# Patient Record
Sex: Male | Born: 1945 | Race: White | Hispanic: No | Marital: Married | Smoking: Never smoker
Health system: Southern US, Academic
[De-identification: ages and names within clinical notes are randomized; demographics above are authoritative.]

---

## 2003-03-27 ENCOUNTER — Ambulatory Visit (INDEPENDENT_AMBULATORY_CARE_PROVIDER_SITE_OTHER): Payer: Self-pay | Admitting: Orthopaedics-Spine Service Non-Operative

## 2004-03-15 ENCOUNTER — Ambulatory Visit (INDEPENDENT_AMBULATORY_CARE_PROVIDER_SITE_OTHER): Payer: Self-pay | Admitting: Orthopaedics-Spine Service Non-Operative

## 2004-06-11 ENCOUNTER — Ambulatory Visit (INDEPENDENT_AMBULATORY_CARE_PROVIDER_SITE_OTHER): Payer: Self-pay | Admitting: Orthopaedics-Spine Service Non-Operative

## 2020-03-26 IMAGING — MR MRI CERVICAL SPINE WITHOUT CONTRAST
4 of 5 series · 23 of 48 positions shown · IV contrast (gadolinium)
Comparison: None available.

﻿EXAM:  MRI CERVICAL SPINE WITHOUT CONTRAST
INDICATION: Neck pain for several months.
TECHNIQUE: Multiplanar multisequential MRI of the cervical spine was performed without gadolinium contrast.

[Series 5: T2 · sagittal · 3.0mm · 0.75mm/px · 8 of 15 slices shown (1 of 2)]
[im 1/15]
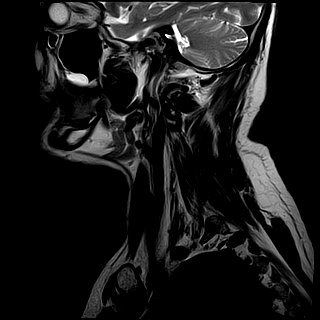
[im 3/15]
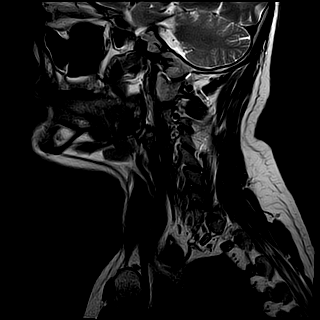
[im 5/15]
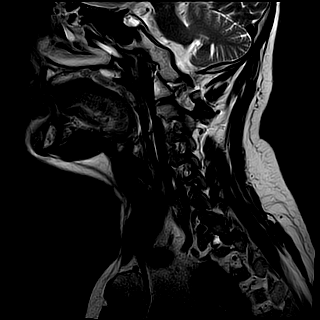
[im 7/15]
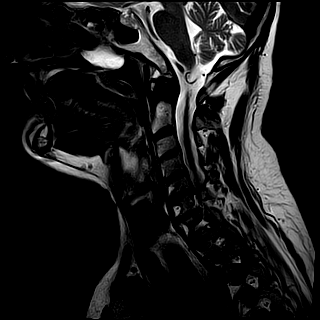
[im 9/15]
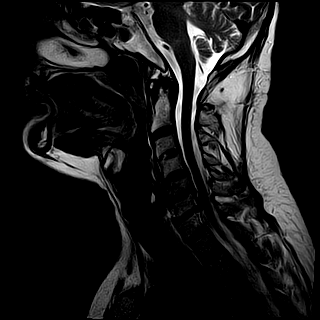
[im 11/15]
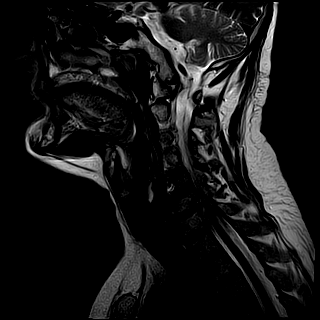
[im 13/15]
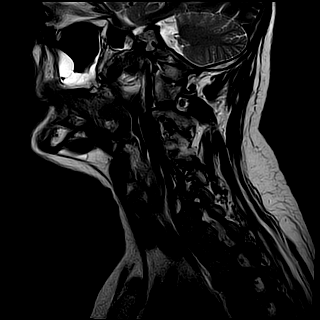
[im 15/15]
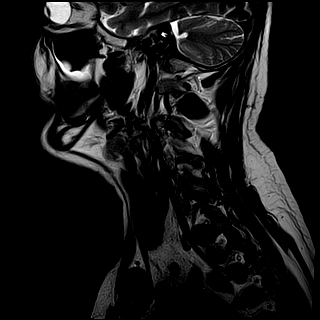

[Series 7: T1 · sagittal · 3.0mm · 0.47mm/px · 3 of 15 slices shown]
[im 2/15]
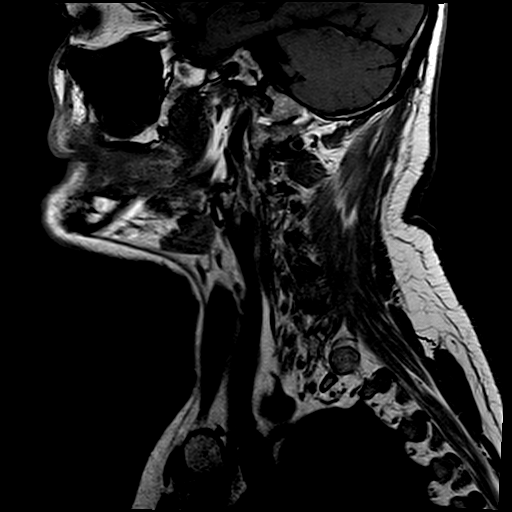
[im 8/15]
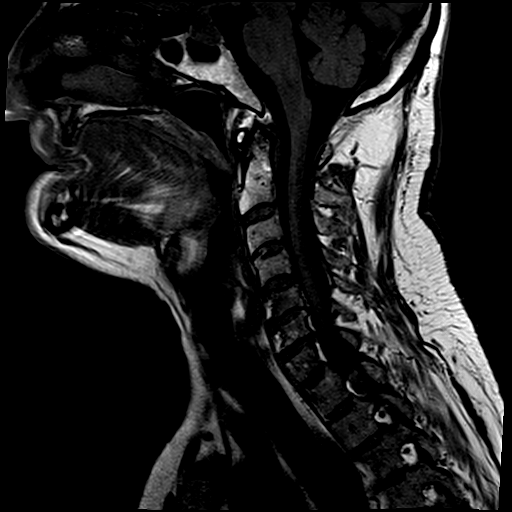
[im 13/15]
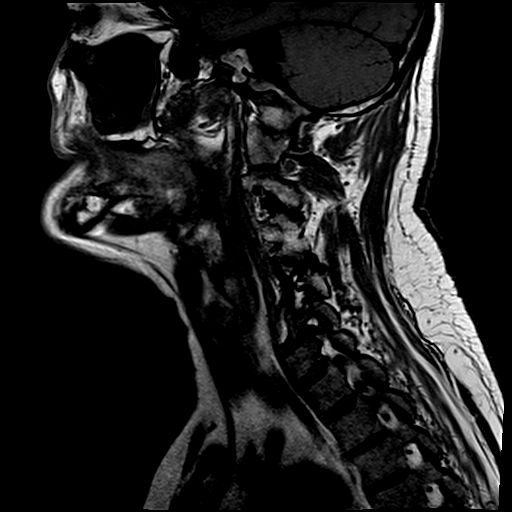

[Series 9: STIR · sagittal · 3.0mm · 0.47mm/px · 3 of 15 slices shown]
[im 2/15]
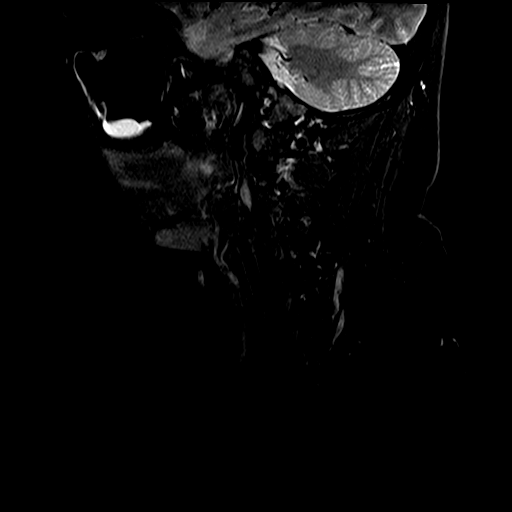
[im 8/15]
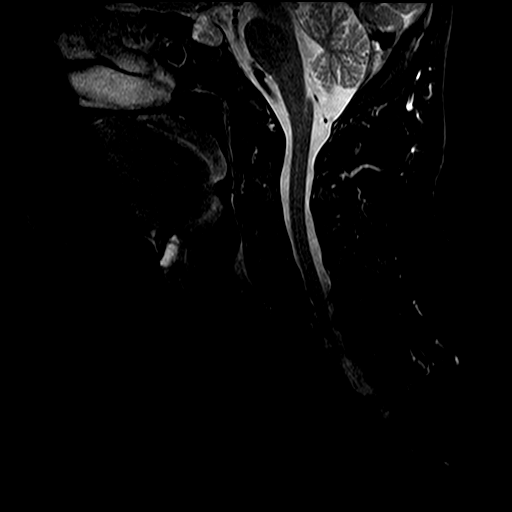
[im 13/15]
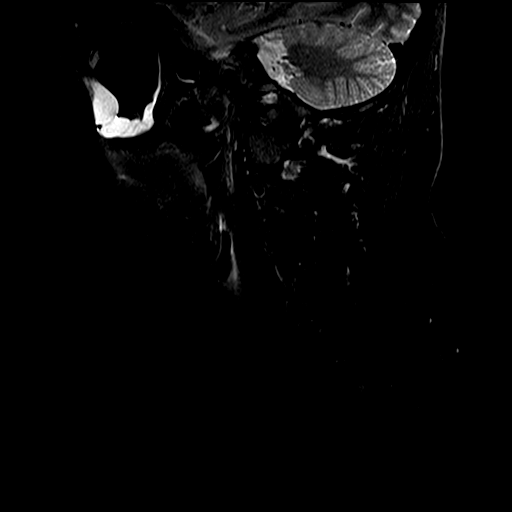

[Series 10: T2 · axial · 3.0mm · 0.39mm/px · z∈[-114,-7]mm · 9 of 18 slices shown (2 of 2)]
[im 1/18]
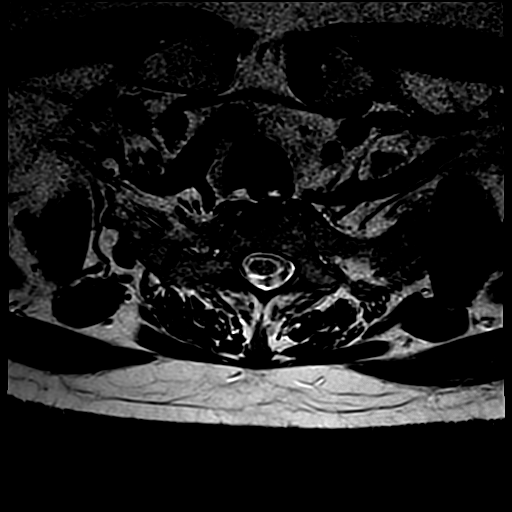
[im 2/18]
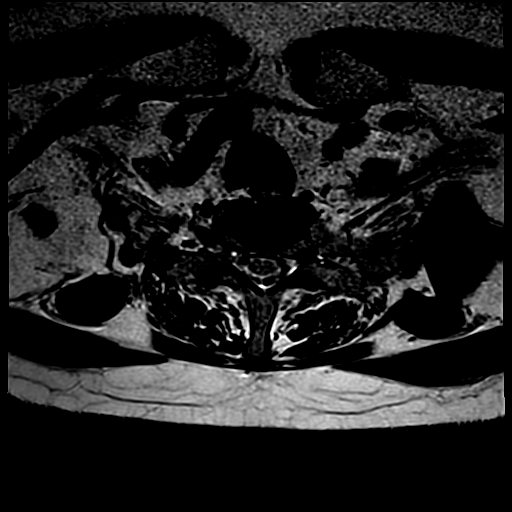
[im 4/18]
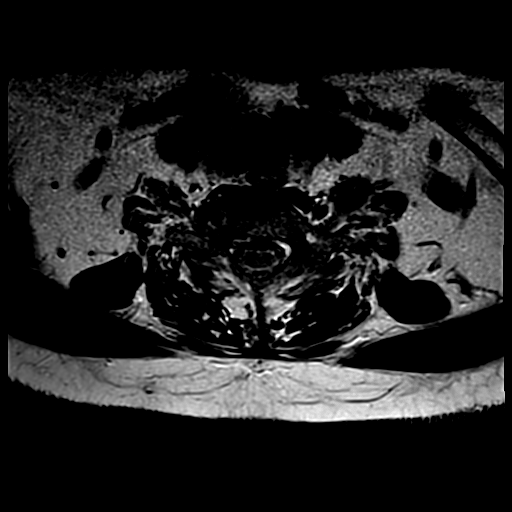
[im 6/18]
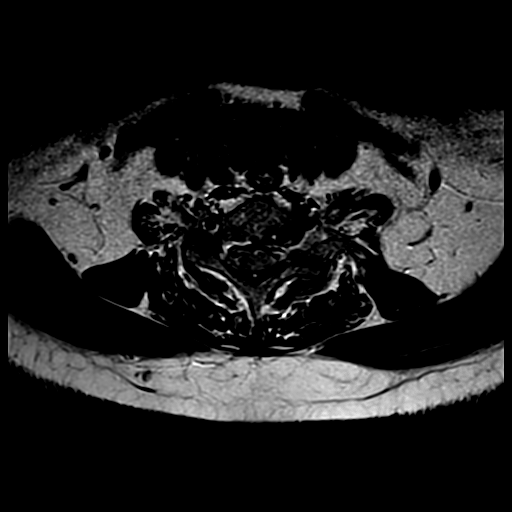
[im 7/18]
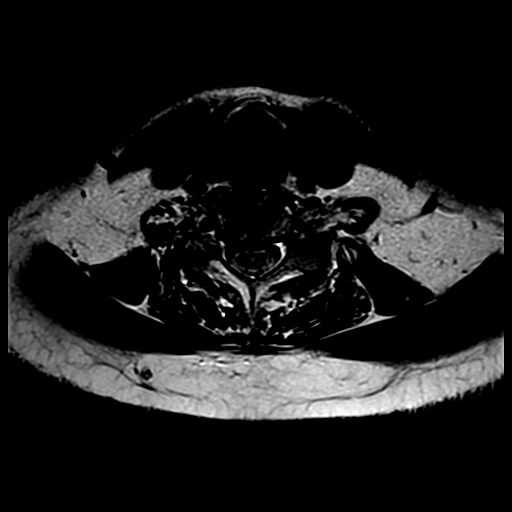
[im 9/18]
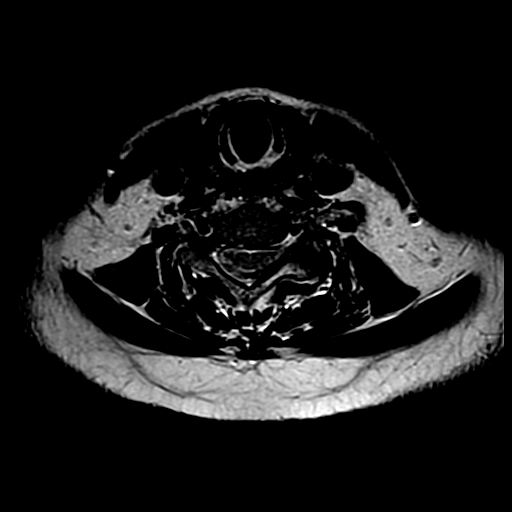
[im 11/18]
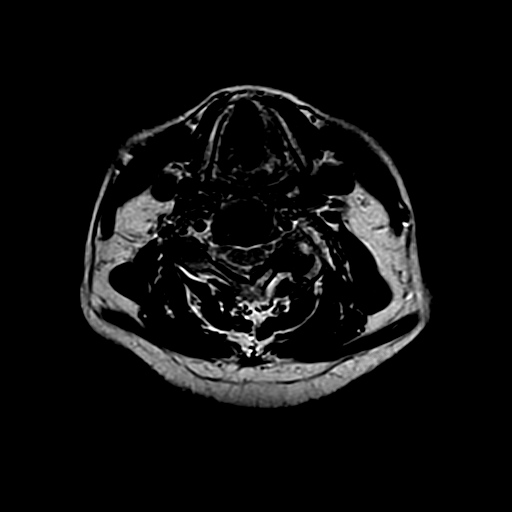
[im 12/18]
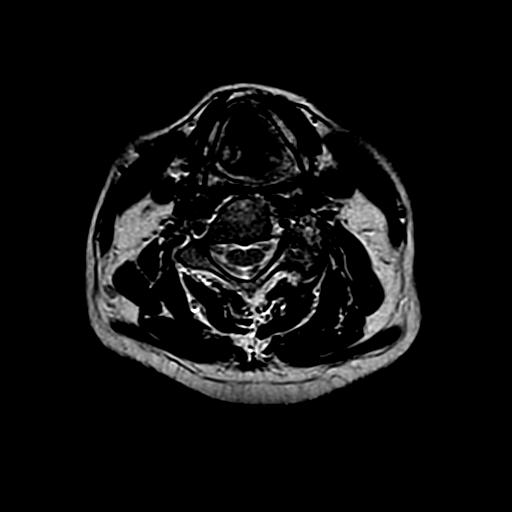
[im 16/18]
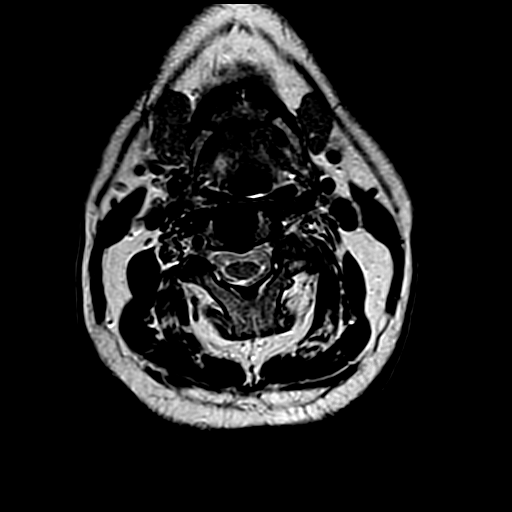

[23 of 48 positions shown; findings below may reference images not displayed]

FINDINGS: Vertebral bodies are normal in height, alignment and signal intensity. There is no acute fracture or subluxation. Visualized spinal cord is also normal in signal intensity without evidence of compression at any level.

At C2-3 level, there is moderate to severe left neural foraminal stenosis from facet arthropathy.

C3-4 level is unremarkable.

At C4-5 level, there is a minimal bulging annulus, minimally effacing the ventral CSF. There is moderate left neural foraminal stenosis from facet and uncovertebral joint hypertrophy.

At C5-6 level, there is a small broad-based central disc osteophyte complex resulting in near complete effacement of the ventral CSF. There is severe right neural foraminal stenosis from facet and uncovertebral joint hypertrophy.

At C6-7 level, there is a small broad-based central disc osteophyte complex with near complete effacement of the ventral CSF. There is severe bilateral neural foraminal stenosis from facet and uncovertebral joint hypertrophy.

At C7-T1 level, there is severe left neural foraminal stenosis from uncovertebral joint hypertrophy.

Paraspinal soft tissues are unremarkable.
IMPRESSION: 1. Near complete effacement of the ventral CSF at C5-6 and C6-7 levels from small central disc osteophyte complexes. 

2. Multilevel neural foraminal stenosis as detailed above.

## 2020-03-28 IMAGING — MR MRI BRAIN WITHOUT AND WITH CONTRAST
10 of 13 series · 35 of 48 positions shown · IV contrast (gadavist)
Comparison: CT head from outside facility dated 03/14/2020.

﻿EXAM:  MRI BRAIN WITHOUT AND WITH CONTRAST
INDICATION: Worsening headaches.
TECHNIQUE: Multiplanar multisequential MRI of the brain was performed without and with 5 mL of Gadavist.

[Series 5: DWI · axial · 5.0mm · 1.35mm/px · z∈[-35,+91]mm · 9 of 88 slices shown (1 of 3)]
[im 1/88]
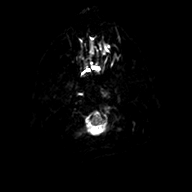
[im 16/88]
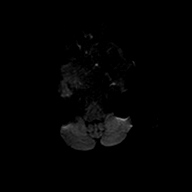
[im 24/88]
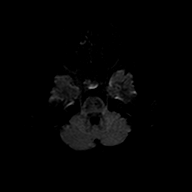
[im 40/88]
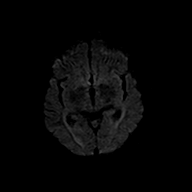
[im 48/88]
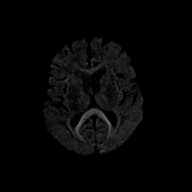
[im 64/88]
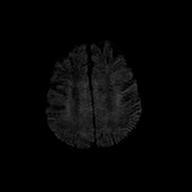
[im 72/88]
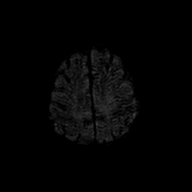
[im 80/88]
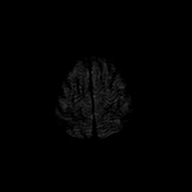
[im 88/88]
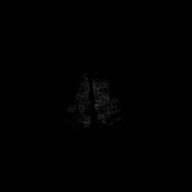

[Series 6: DWI · axial · 5.0mm · 1.35mm/px · z∈[-35,+91]mm · 3 of 22 slices shown (2 of 3)]
[im 1/22]
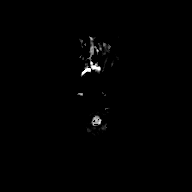
[im 11/22]
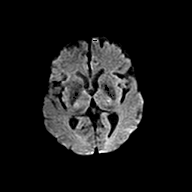
[im 22/22]
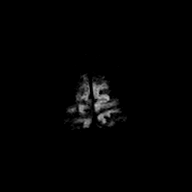

[Series 7: DWI · axial · 5.0mm · 1.35mm/px · z∈[-35,+91]mm · 3 of 22 slices shown (3 of 3)]
[im 1/22]
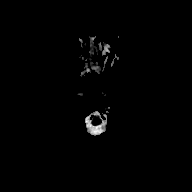
[im 11/22]
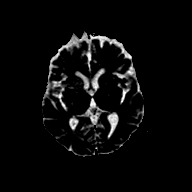
[im 22/22]
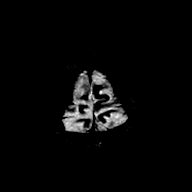

[Series 8: FLAIR · sagittal · 4.0mm · 0.75mm/px · 3 of 26 slices shown (1 of 2)]
[im 1/26]
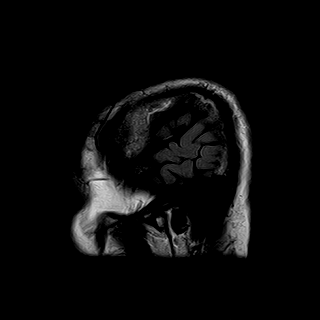
[im 13/26]
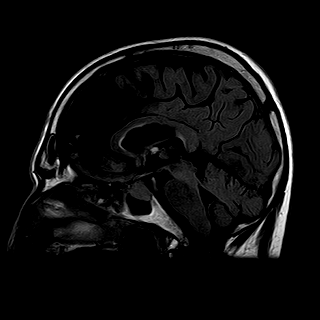
[im 26/26]
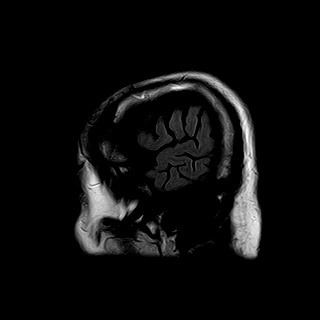

[Series 9: T2 · axial · 5.0mm · 0.43mm/px · z∈[-43,+113]mm · 3 of 27 slices shown (1 of 2)]
[im 1/27]
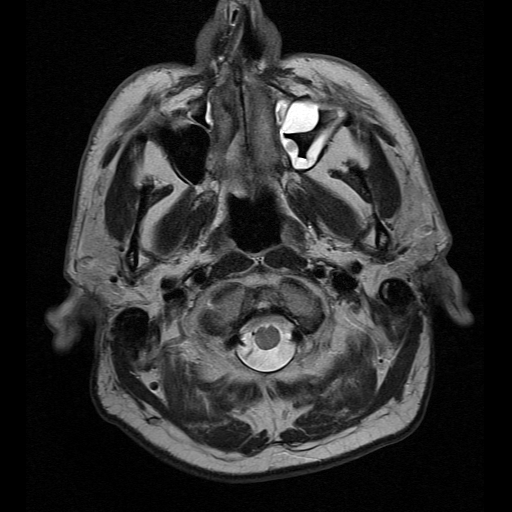
[im 14/27]
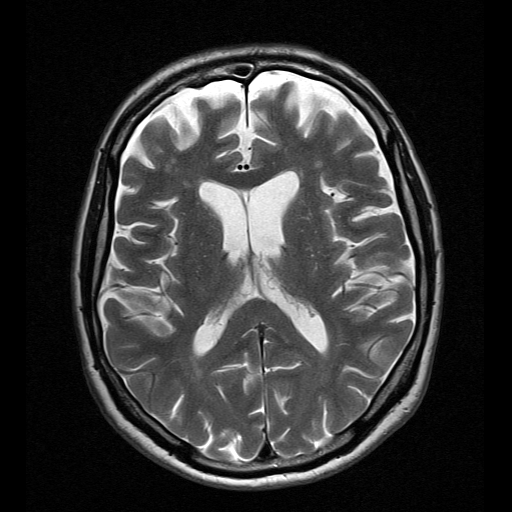
[im 27/27]
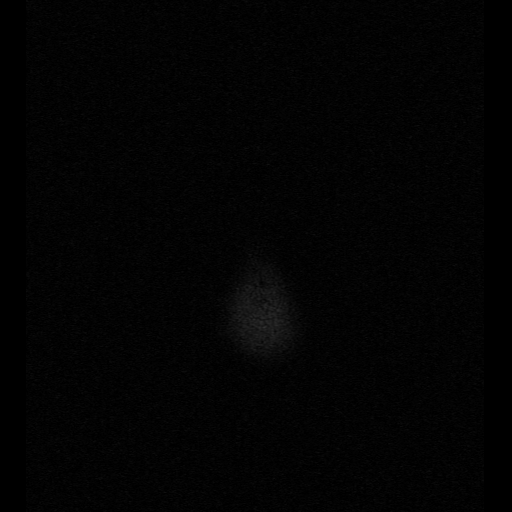

[Series 10: FLAIR · axial · 5.0mm · 0.43mm/px · z∈[-43,+113]mm · 3 of 27 slices shown (2 of 2)]
[im 1/27]
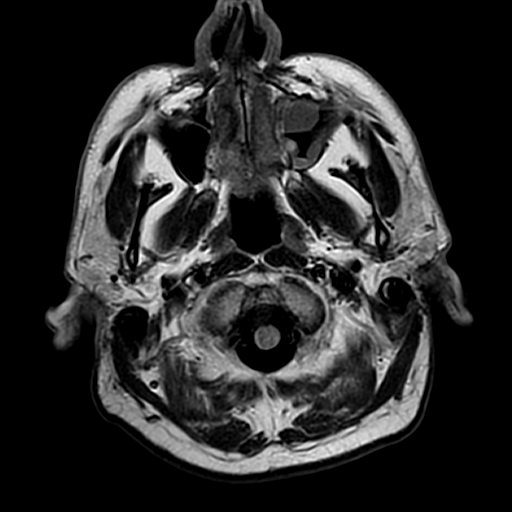
[im 14/27]
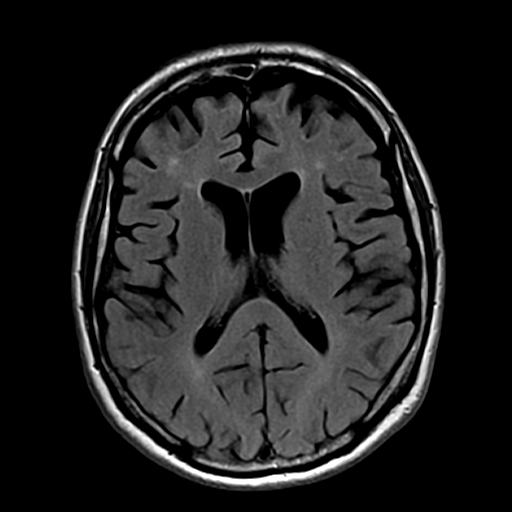
[im 27/27]
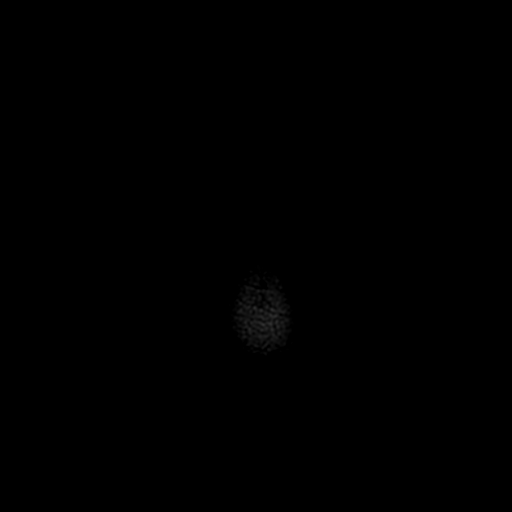

[Series 12: T1 · axial · 5.0mm · 0.43mm/px · z∈[-43,+113]mm · 3 of 27 slices shown]
[im 1/27]
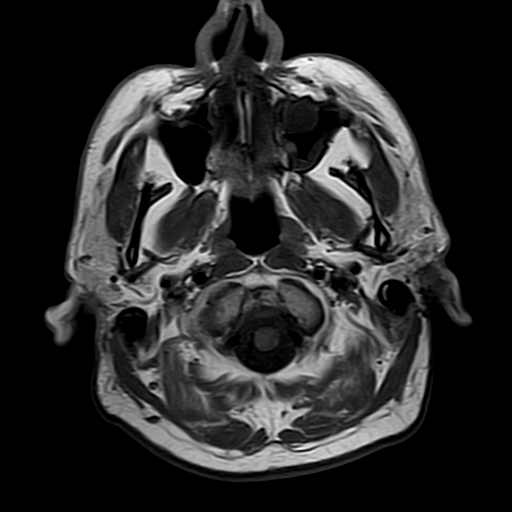
[im 14/27]
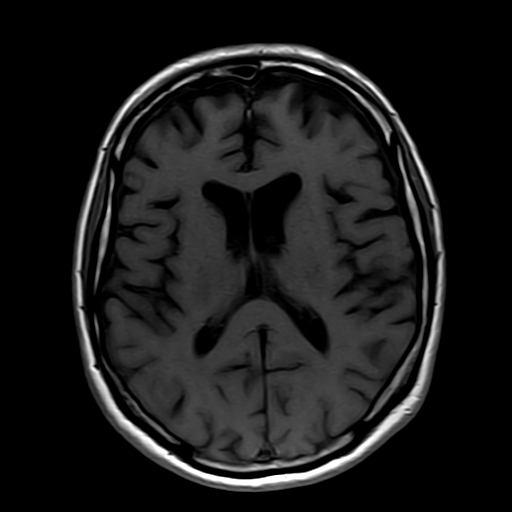
[im 27/27]
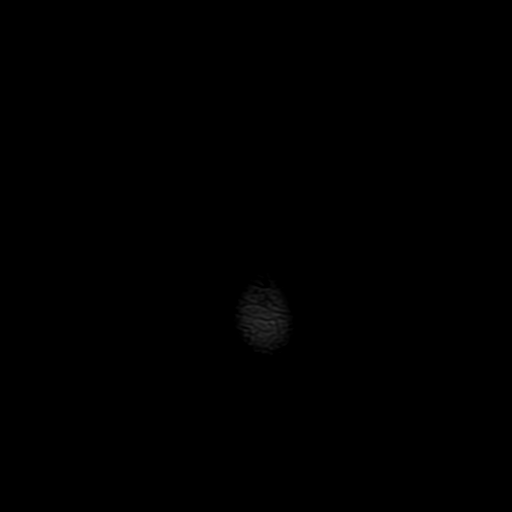

[Series 13: T2 · coronal · 6.0mm · 0.43mm/px · 3 of 24 slices shown (2 of 2)]
[im 1/24]
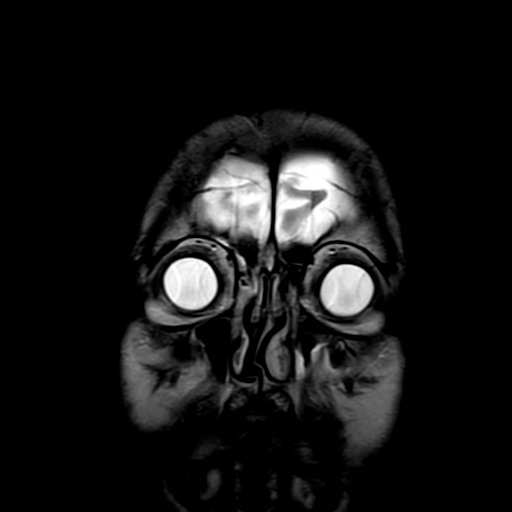
[im 12/24]
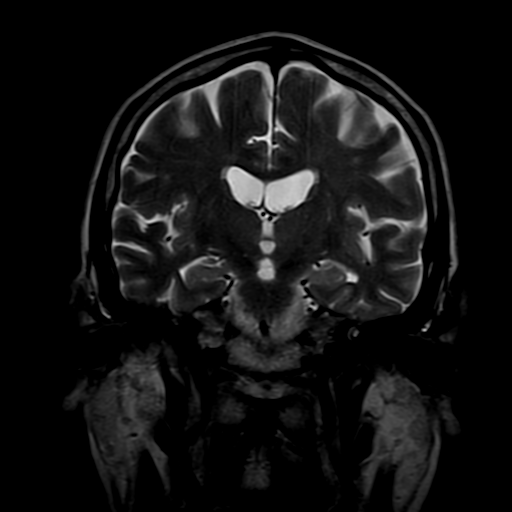
[im 24/24]
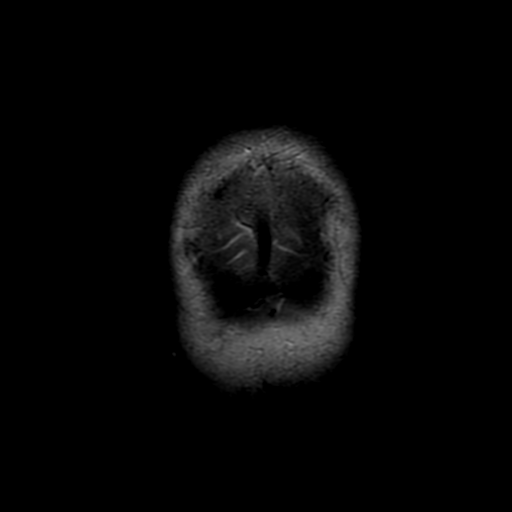

[Series 14: T1 fat-sat · coronal · 6.0mm · 0.57mm/px · 3 of 24 slices shown (1 of 2)]
[im 1/24]
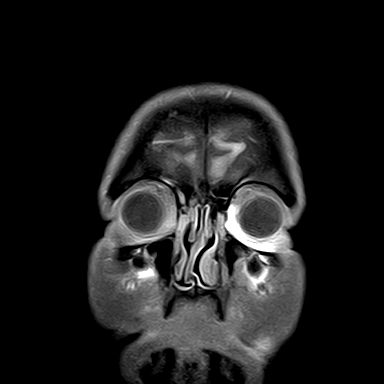
[im 12/24]
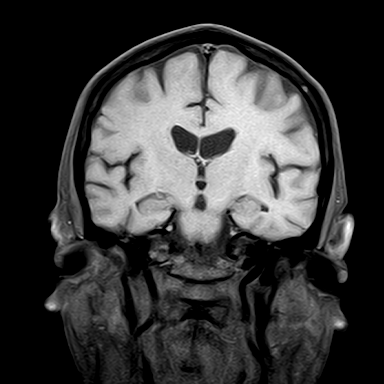
[im 24/24]
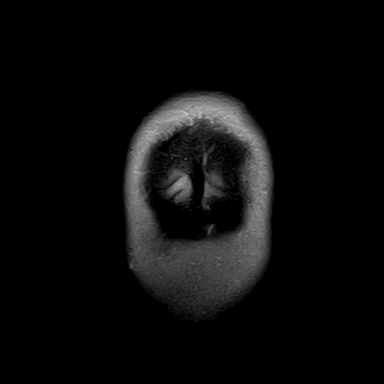

[Series 15: T1 fat-sat · axial · 5.0mm · 0.43mm/px · z∈[-43,+35]mm · 2 of 27 slices shown (2 of 2)]
[im 1/27]
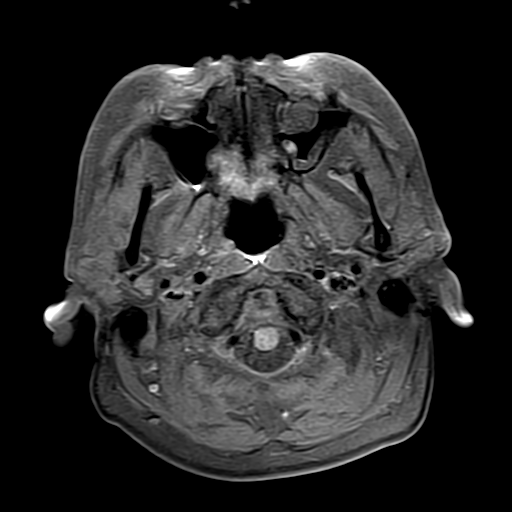
[im 14/27]
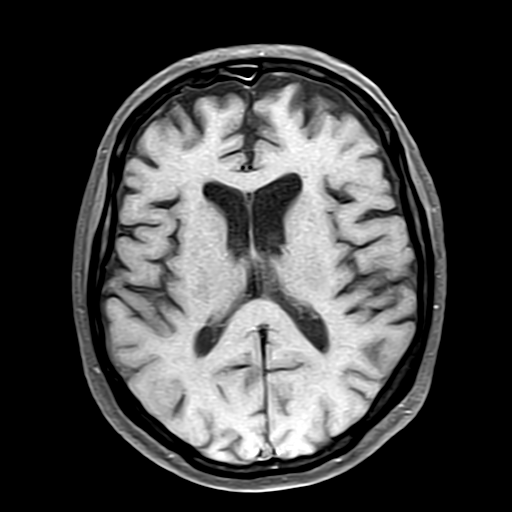

[35 of 48 positions shown; findings below may reference images not displayed]

FINDINGS: Ventricular and sulcal size is normal for the patient's age. There are mild chronic small vessel ischemic changes. There is no mass effect, midline shift or intracranial hemorrhage. There is no evidence of acute infarction or prior microhemorrhages. Skull base flow voids and basal cisterns are patent. There is a 2.1 x 1.4 cm enhancing sellar mass with suprasellar extension and mild mass effect on the optic chiasm. This mass also is in close proximity to the right internal carotid artery. There is no abnormal parenchymal or leptomeningeal enhancement. There are no extra-axial fluid collections. Visualized paranasal sinuses, mastoid air cells and orbital contents are unremarkable.
IMPRESSION: 1. Mild chronic small vessel ischemic changes, no acute intracranial abnormality. 

 2. Imaging findings most suggestive of a pituitary macroadenoma as detailed above. Continued follow-up is recommended.

## 2020-09-05 ENCOUNTER — Ambulatory Visit (HOSPITAL_COMMUNITY): Payer: Self-pay | Admitting: INTERNAL MEDICINE-ENDOCRINOLOGY-DIABETES AND METABOLISM

## 2021-02-06 IMAGING — MR MRI BRAIN WITHOUT AND WITH CONTRAST
11 of 13 series · 39 of 48 positions shown · IV contrast (gadavist)
Comparison: MRI dated 03/28/2020.

﻿EXAM:  MRI BRAIN WITHOUT AND WITH CONTRAST
INDICATION: Follow-up pituitary mass.
TECHNIQUE: Multiplanar multisequential MRI of the brain and pituitary gland was performed without and with 5 mL of Gadavist.

[Series 5: DWI · axial · 5.0mm · 1.35mm/px · z∈[-55,+71]mm · 11 of 88 slices shown (1 of 3)]
[im 1/88]
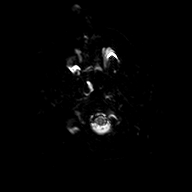
[im 9/88]
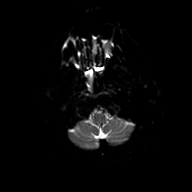
[im 18/88]
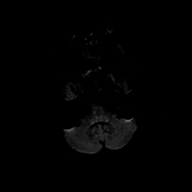
[im 27/88]
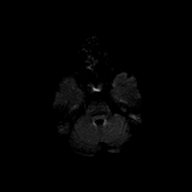
[im 35/88]
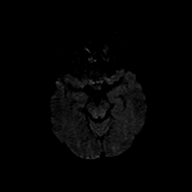
[im 44/88]
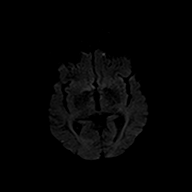
[im 53/88]
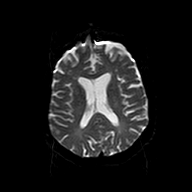
[im 61/88]
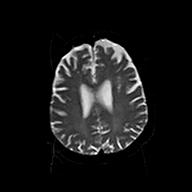
[im 70/88]
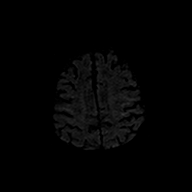
[im 79/88]
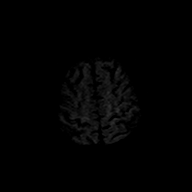
[im 88/88]
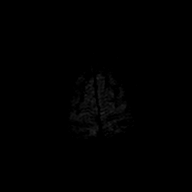

[Series 6: DWI · axial · 5.0mm · 1.35mm/px · z∈[-55,+71]mm · 2 of 22 slices shown (2 of 3)]
[im 1/22]
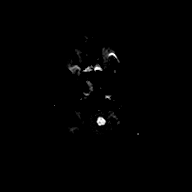
[im 22/22]
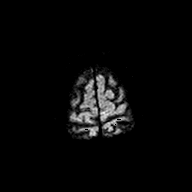

[Series 7: DWI · axial · 5.0mm · 1.35mm/px · z∈[-55,+71]mm · 3 of 22 slices shown (3 of 3)]
[im 1/22]
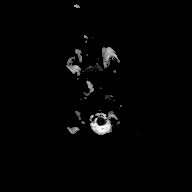
[im 11/22]
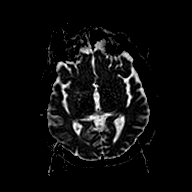
[im 22/22]
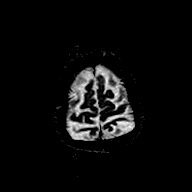

[Series 8: FLAIR · sagittal · 4.0mm · 0.75mm/px · 4 of 26 slices shown (1 of 2)]
[im 1/26]
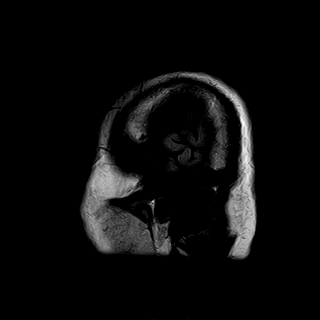
[im 9/26]
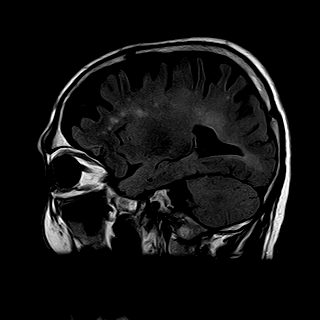
[im 17/26]
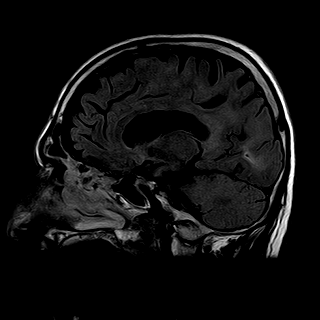
[im 26/26]
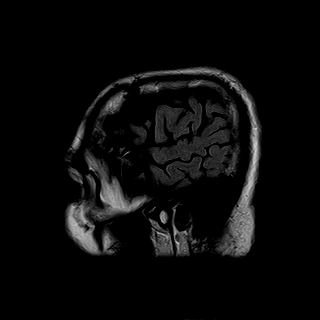

[Series 9: T2 · axial · 5.0mm · 0.43mm/px · z∈[-60,+96]mm · 4 of 27 slices shown]
[im 1/27]
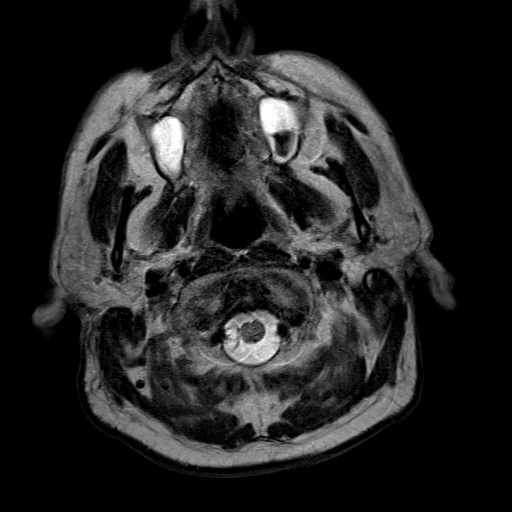
[im 9/27]
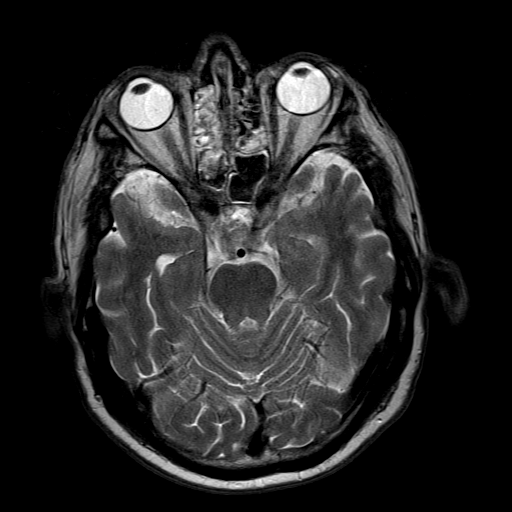
[im 18/27]
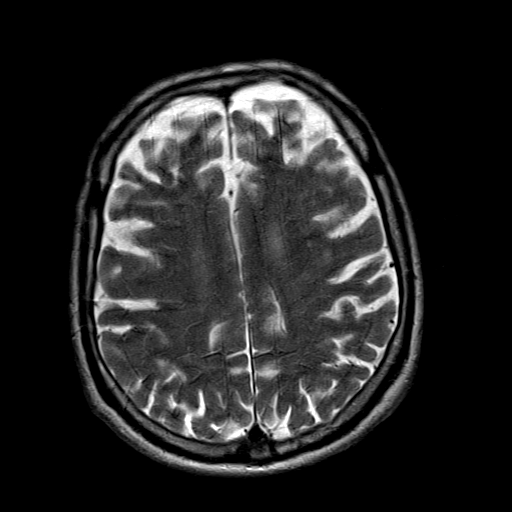
[im 27/27]
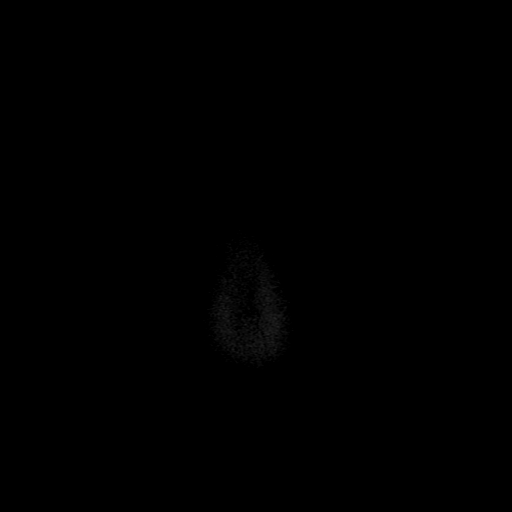

[Series 10: FLAIR · axial · 5.0mm · 0.43mm/px · z∈[-60,+96]mm · 4 of 27 slices shown (2 of 2)]
[im 1/27]
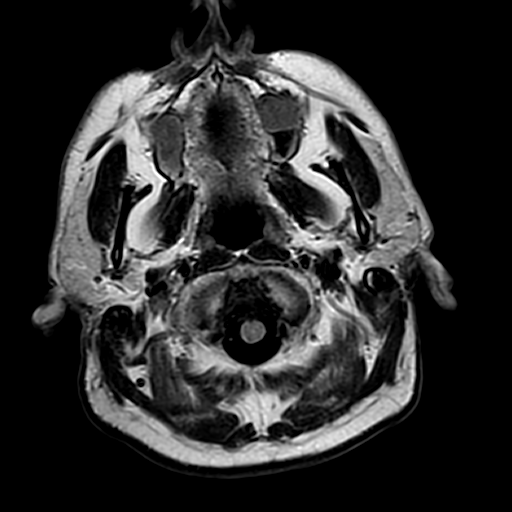
[im 9/27]
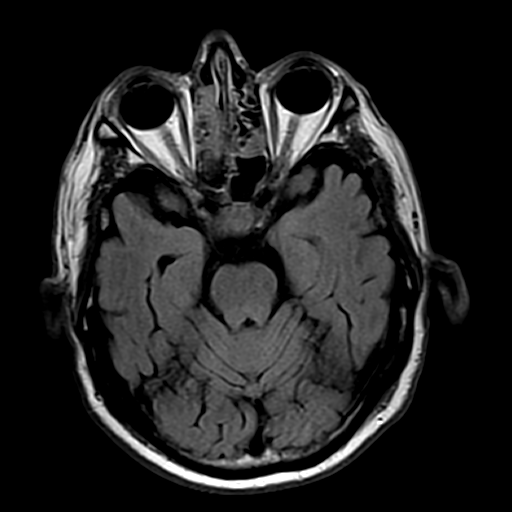
[im 18/27]
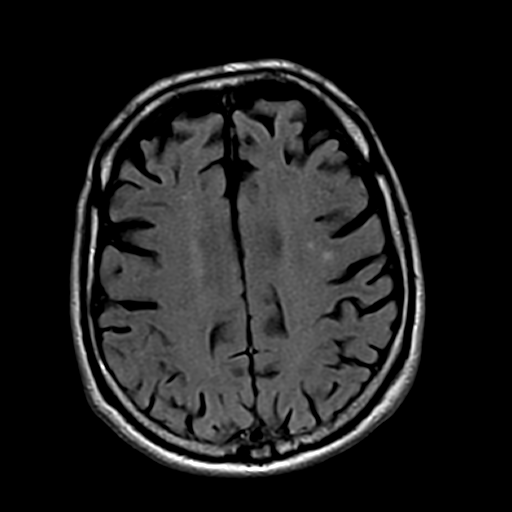
[im 27/27]
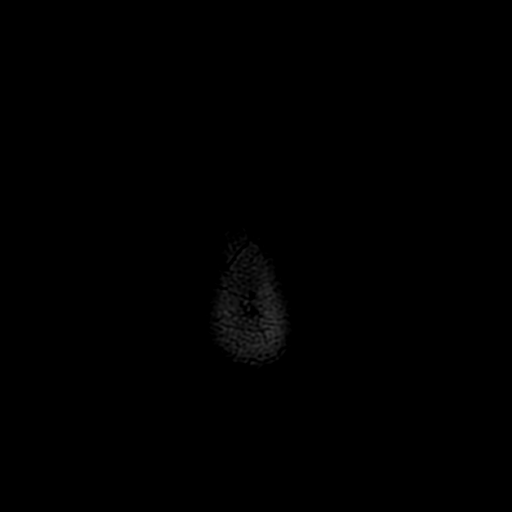

[Series 11: T1 · axial · 5.0mm · 0.43mm/px · z∈[-60,+96]mm · 4 of 27 slices shown (1 of 2)]
[im 1/27]
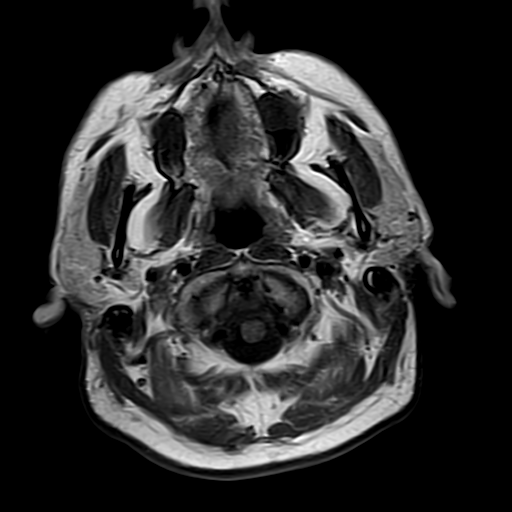
[im 9/27]
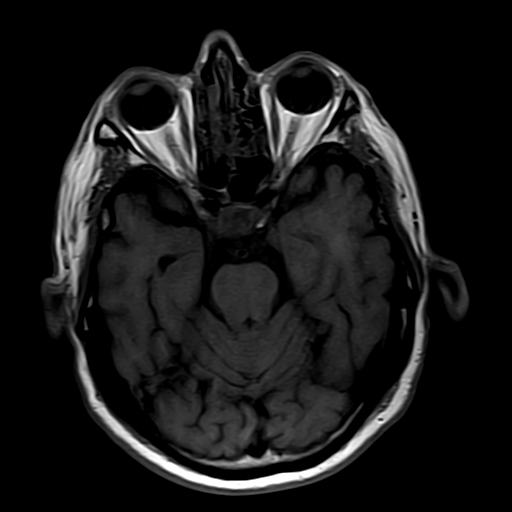
[im 18/27]
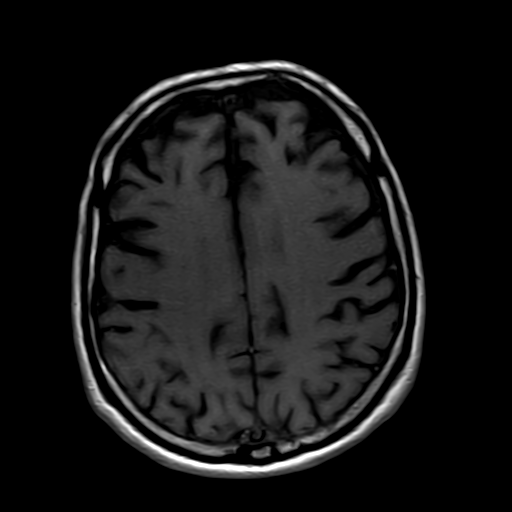
[im 27/27]
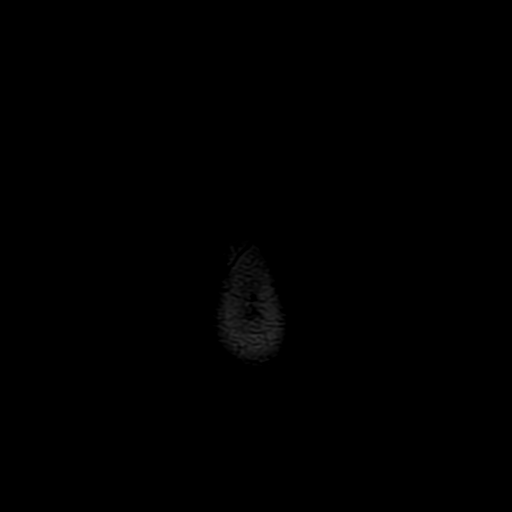

[Series 12: T1 · coronal · 3.0mm · 0.56mm/px · 2 of 13 slices shown (2 of 2)]
[im 1/13]
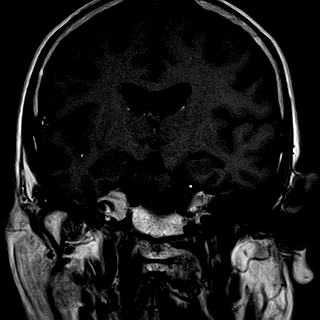
[im 13/13]
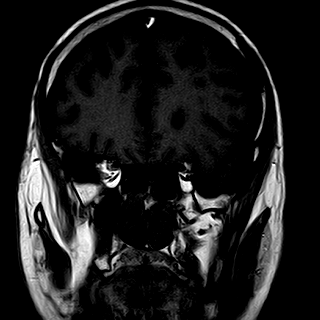

[Series 13: T1 fat-sat · axial · 5.0mm · 0.57mm/px · 1 of 27 slices shown]
[im 1/27]
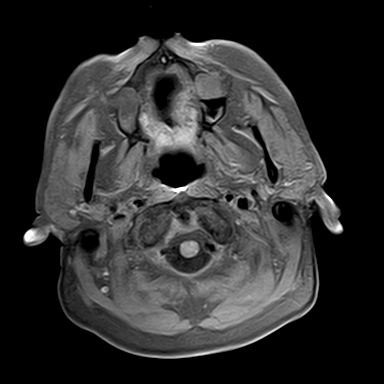

[Series 15: T1 post-contrast · coronal · 3.0mm · 0.56mm/px · 2 of 13 slices shown (1 of 2)]
[im 1/13]
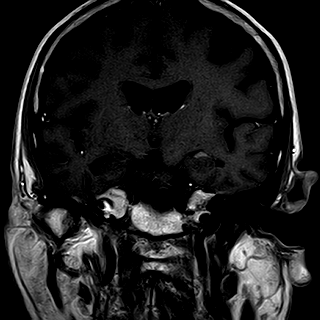
[im 13/13]
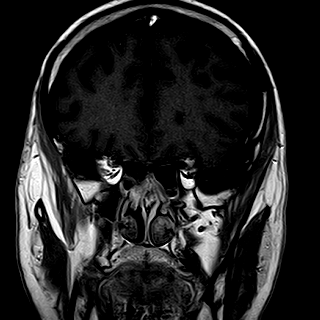

[Series 16: T1 post-contrast · sagittal · 3.0mm · 0.56mm/px · 2 of 13 slices shown (2 of 2)]
[im 1/13]
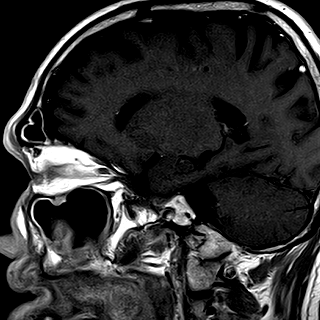
[im 13/13]
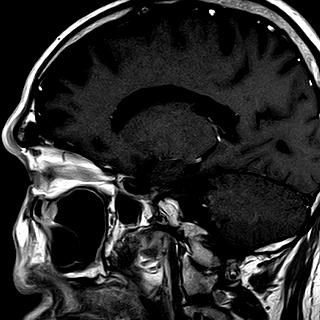

[39 of 48 positions shown; findings below may reference images not displayed]

FINDINGS: Ventricular and sulcal size is normal for the patient's age. There are mild chronic small vessel ischemic changes. There is no mass effect, midline shift or intracranial hemorrhage. There is no evidence of acute infarction or prior microhemorrhages. Skull base flow voids and basal cisterns are patent. A 2.1 x 1.4 cm pituitary macroadenoma is stable in size and redemonstrates mild mass effect on the optic chiasm. There is no abnormal parenchymal or leptomeningeal enhancement. There are no extra-axial fluid collections. There is partial opacification of all paranasal sinuses. Mastoid air cells and orbital contents are unremarkable.
IMPRESSION: 1. Stable pituitary macroadenoma. 

 2. Stable mild chronic small vessel ischemic changes, no acute intracranial abnormality.

## 2021-05-11 ENCOUNTER — Emergency Department (HOSPITAL_COMMUNITY): Payer: Self-pay | Admitting: FAMILY PRACTICE

## 2022-01-14 IMAGING — MR MRI BRAIN WITHOUT AND WITH CONTRAST
10 of 12 series · 35 of 48 positions shown · IV contrast (gadavist)
Comparison: Last MRI examination of brain and pituitary gland dated 02/06/2021.

﻿EXAM:  MRI BRAIN WITHOUT AND WITH CONTRAST INCLUDING PITUITARY GLAND:
INDICATION: 76-year-old with diagnosis of pituitary macroadenoma for follow-up.  No history of surgery of the pituitary fossa.  No mention of any medical treatment for pituitary macro adenoma.
TECHNIQUE: Multiplanar, multisequential MRI of the brain was performed without and with 10 mL of Gadavist.  Thin section images of pituitary gland and suprasellar region were performed pre and postcontrast.

[Series 5: DWI · axial · 5.0mm · 1.35mm/px · z∈[-50,+76]mm · 8 of 88 slices shown (1 of 3)]
[im 1/88]
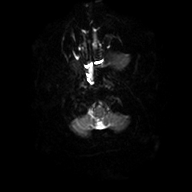
[im 14/88]
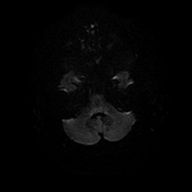
[im 27/88]
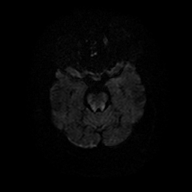
[im 41/88]
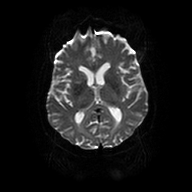
[im 47/88]
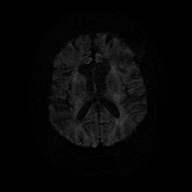
[im 61/88]
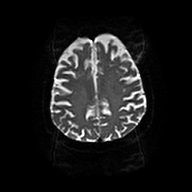
[im 74/88]
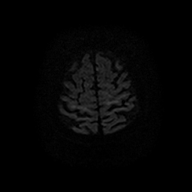
[im 88/88]
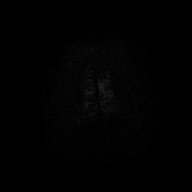

[Series 6: DWI · axial · 5.0mm · 1.35mm/px · z∈[-50,+76]mm · 3 of 22 slices shown (2 of 3)]
[im 1/22]
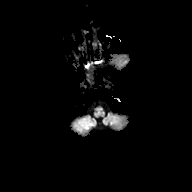
[im 11/22]
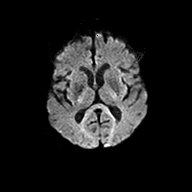
[im 22/22]
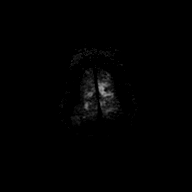

[Series 7: DWI · axial · 5.0mm · 1.35mm/px · z∈[-50,+76]mm · 3 of 22 slices shown (3 of 3)]
[im 1/22]
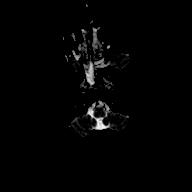
[im 11/22]
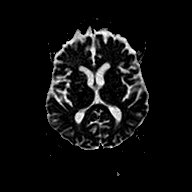
[im 22/22]
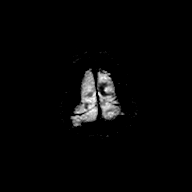

[Series 8: FLAIR · sagittal · 4.0mm · 0.75mm/px · 4 of 26 slices shown (1 of 2)]
[im 1/26]
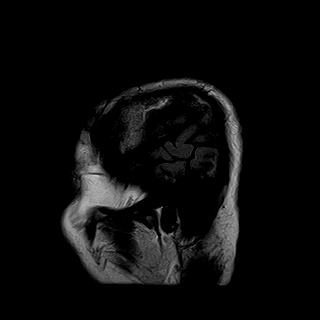
[im 9/26]
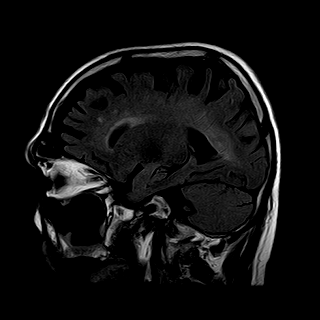
[im 17/26]
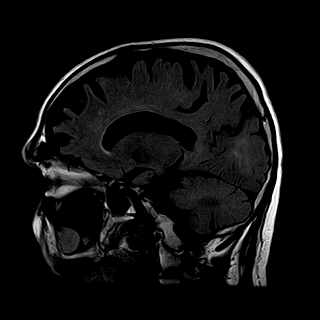
[im 26/26]
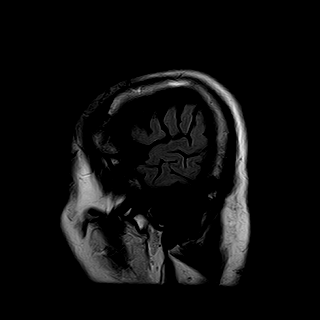

[Series 9: T2 · axial · 5.0mm · 0.43mm/px · z∈[-66,+78]mm · 4 of 25 slices shown]
[im 1/25]
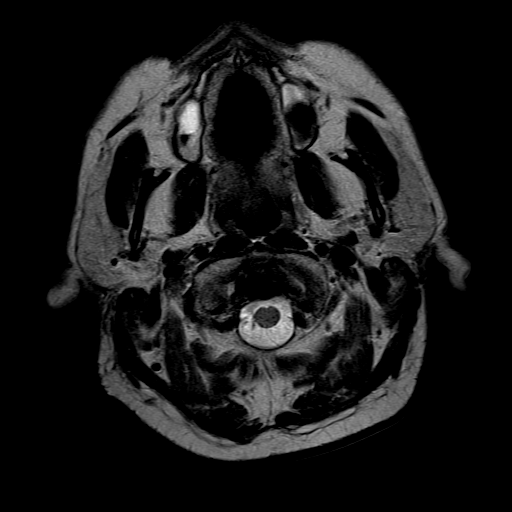
[im 9/25]
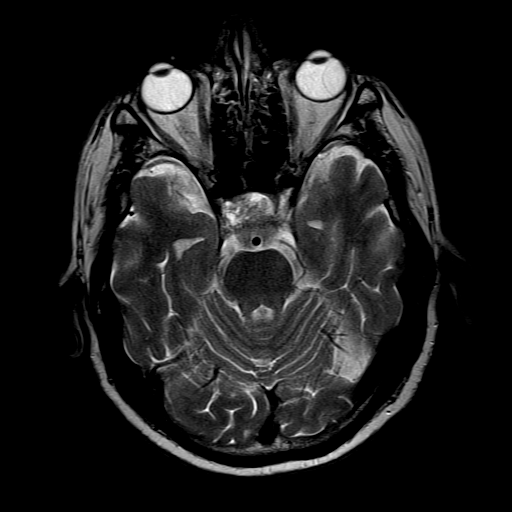
[im 17/25]
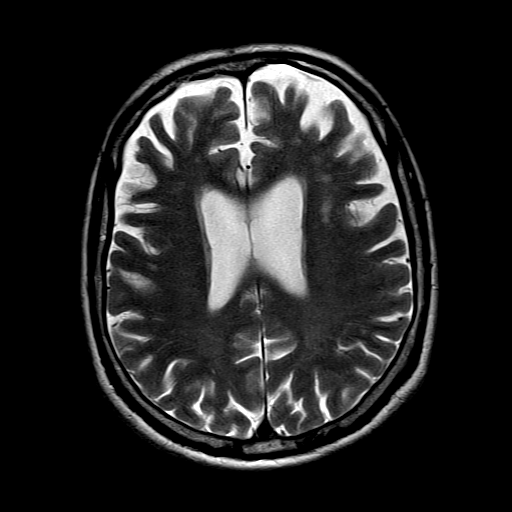
[im 25/25]
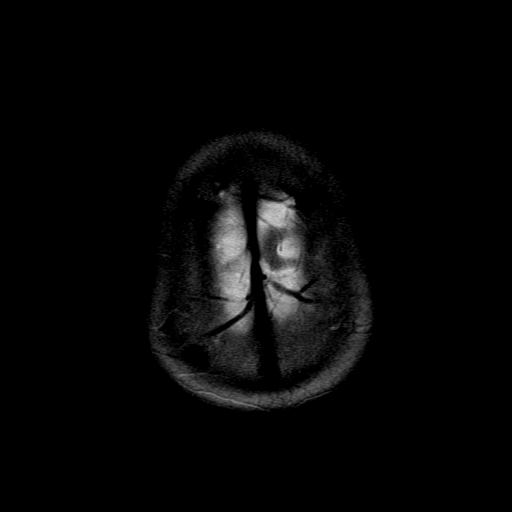

[Series 10: FLAIR · axial · 5.0mm · 0.43mm/px · z∈[-66,+78]mm · 4 of 25 slices shown (2 of 2)]
[im 1/25]
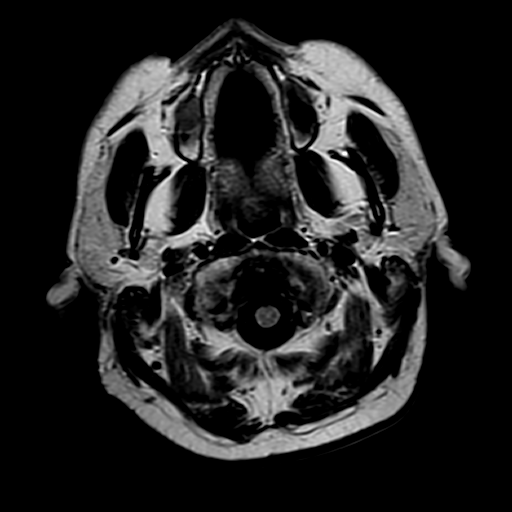
[im 9/25]
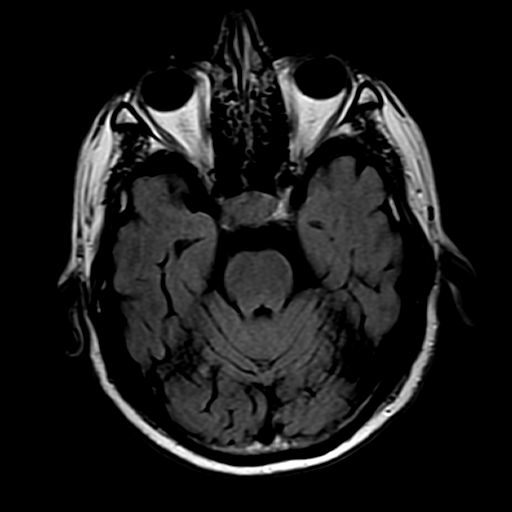
[im 17/25]
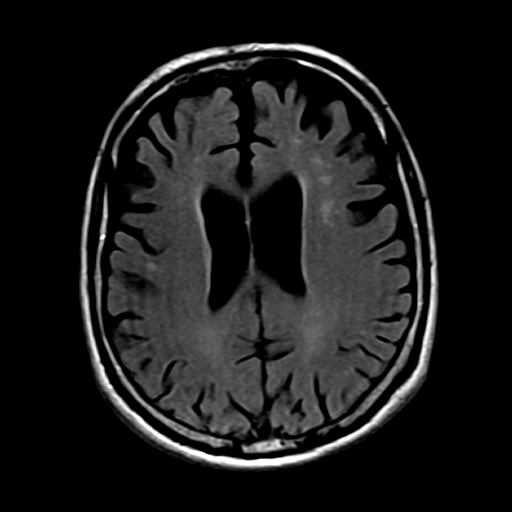
[im 25/25]
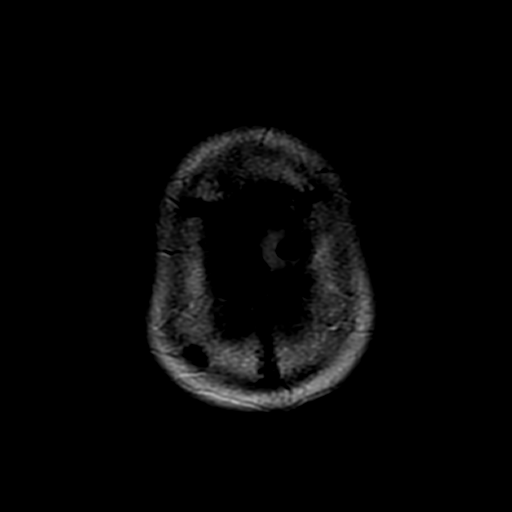

[Series 11: T1 · axial · 5.0mm · 0.43mm/px · 1 of 25 slices shown]
[im 1/25]
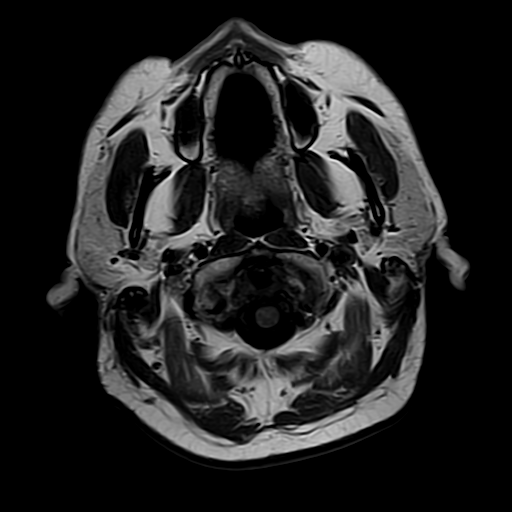

[Series 14: T1 post-contrast · coronal · 3.0mm · 0.56mm/px · 2 of 13 slices shown (1 of 3)]
[im 1/13]
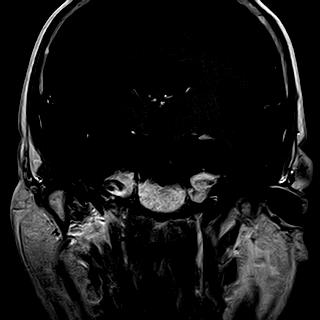
[im 13/13]
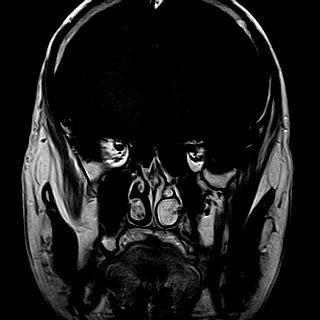

[Series 15: T1 post-contrast · sagittal · 3.0mm · 0.56mm/px · 2 of 13 slices shown (2 of 3)]
[im 1/13]
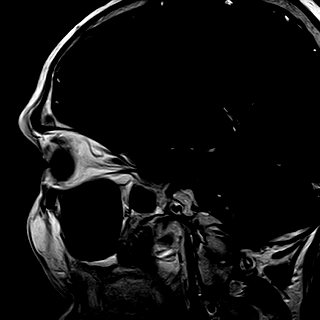
[im 13/13]
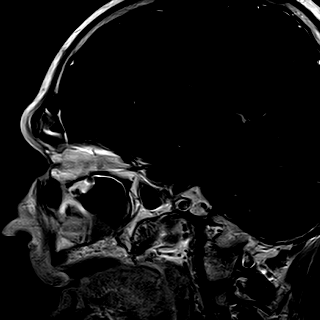

[Series 16: T1 post-contrast · axial · 5.0mm · 0.43mm/px · z∈[-66,+78]mm · 4 of 25 slices shown (3 of 3)]
[im 1/25]
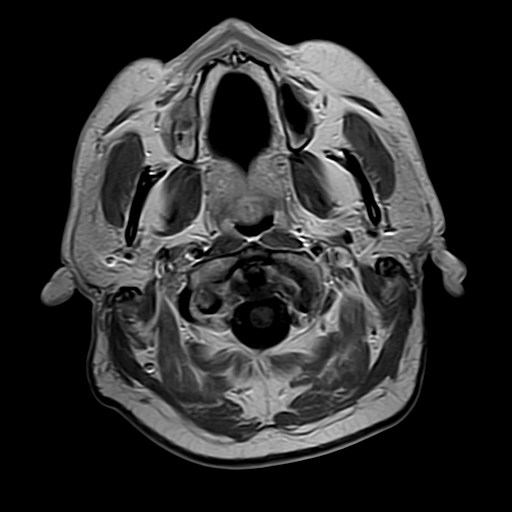
[im 9/25]
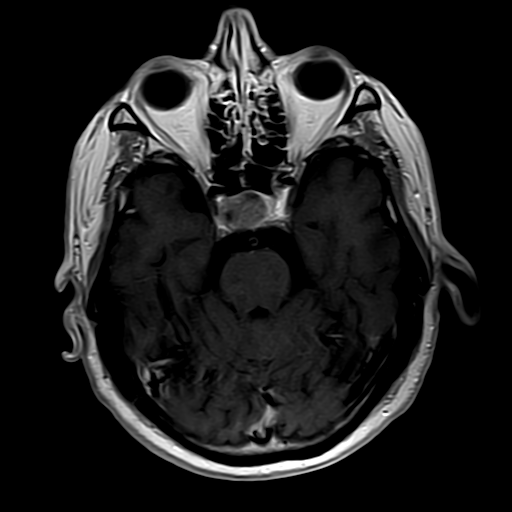
[im 17/25]
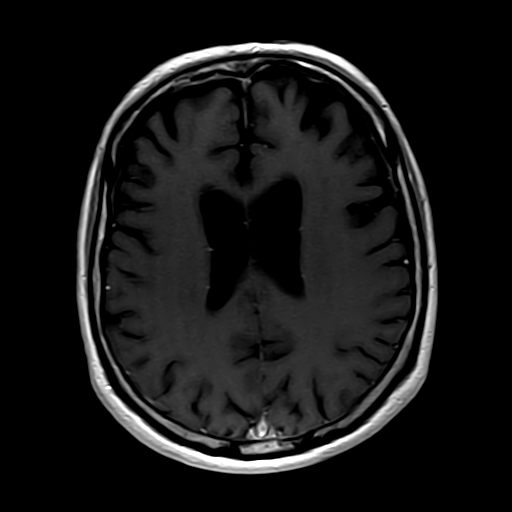
[im 25/25]
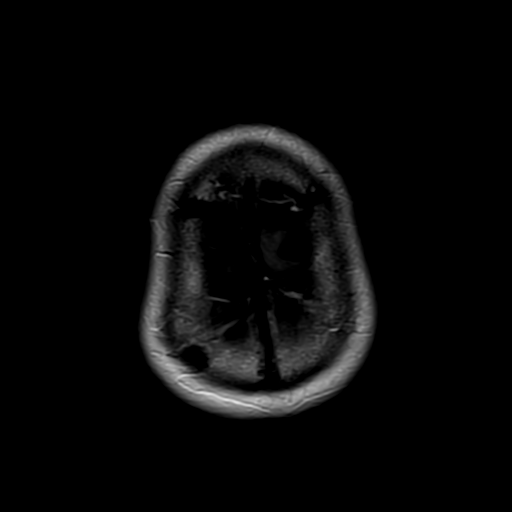

[35 of 48 positions shown; findings below may reference images not displayed]

FINDINGS: No focal areas of restricted diffusion.  No intracranial bleed or extra-axial collections.  No evidence of ventriculomegaly.

Pituitary macroadenoma is noted to the right of the midline extending to the suprasellar cistern.  The macroadenoma measures 16 x 15 x 16 mm, unchanged in size from prior studies.  Heterogeneous internal texture of the mass is noted.  Pituitary infundibulum is shifted to the left.

No other intracranial lesions or enhancement are noted.  Major arteries of circle of Willis and dural venous sinuses are patent.  Sinusitis involving maxillary and ethmoid sinuses are noted.
IMPRESSION: 1. Stable size of macroadenoma mass of the pituitary gland, to the right of the midline measuring 16 x 15 x 16 mm in size. Stable heterogeneous internal texture of the mass.  Extension of the pituitary mass to the suprasellar cistern is noted again similar to the prior studies with shift of the pituitary infundibulum to the left.  Mass is slightly impinging on optic chiasm.

2. No ventriculomegaly or midline shift.

3. Sinusitis.

## 2022-01-23 ENCOUNTER — Ambulatory Visit (INDEPENDENT_AMBULATORY_CARE_PROVIDER_SITE_OTHER): Payer: Self-pay | Admitting: Neurological Surgery

## 2022-01-23 NOTE — Telephone Encounter (Signed)
I left a voicemail to advised of appt, date, time, location, and details about appt available on MY CHART if applicable.  I provided call back number, option, extension, and hours. Advise patient to bring imaging disc and report to the appointment.     Shawnie Pons, Ambulatory Care Assistant

## 2022-04-16 ENCOUNTER — Ambulatory Visit (INDEPENDENT_AMBULATORY_CARE_PROVIDER_SITE_OTHER): Payer: 59 | Admitting: Neurological Surgery

## 2022-05-07 ENCOUNTER — Ambulatory Visit (INDEPENDENT_AMBULATORY_CARE_PROVIDER_SITE_OTHER): Payer: 59 | Admitting: Neurological Surgery

## 2022-06-25 ENCOUNTER — Ambulatory Visit (INDEPENDENT_AMBULATORY_CARE_PROVIDER_SITE_OTHER): Payer: 59 | Admitting: Neurological Surgery

## 2022-07-23 ENCOUNTER — Ambulatory Visit (INDEPENDENT_AMBULATORY_CARE_PROVIDER_SITE_OTHER): Payer: 59 | Admitting: Neurological Surgery

## 2022-08-20 ENCOUNTER — Ambulatory Visit (INDEPENDENT_AMBULATORY_CARE_PROVIDER_SITE_OTHER): Payer: 59 | Admitting: Neurological Surgery

## 2022-09-08 ENCOUNTER — Ambulatory Visit (INDEPENDENT_AMBULATORY_CARE_PROVIDER_SITE_OTHER): Payer: 59 | Admitting: Neurological Surgery

## 2022-10-06 ENCOUNTER — Ambulatory Visit (INDEPENDENT_AMBULATORY_CARE_PROVIDER_SITE_OTHER): Payer: Self-pay | Admitting: Neurological Surgery

## 2022-11-10 ENCOUNTER — Ambulatory Visit (INDEPENDENT_AMBULATORY_CARE_PROVIDER_SITE_OTHER): Payer: Self-pay | Admitting: Neurological Surgery

## 2022-12-15 ENCOUNTER — Ambulatory Visit (INDEPENDENT_AMBULATORY_CARE_PROVIDER_SITE_OTHER): Payer: Self-pay | Admitting: Neurological Surgery

## 2023-03-02 ENCOUNTER — Ambulatory Visit (INDEPENDENT_AMBULATORY_CARE_PROVIDER_SITE_OTHER): Payer: Medicare PPO | Admitting: Neurological Surgery

## 2023-04-16 ENCOUNTER — Emergency Department
Admission: EM | Admit: 2023-04-16 | Discharge: 2023-04-16 | Disposition: A | Discharge status: Home / Self Care | Payer: 59 | Attending: EMERGENCY MEDICINE | Admitting: EMERGENCY MEDICINE

## 2023-04-16 ENCOUNTER — Emergency Department (HOSPITAL_COMMUNITY): Payer: 59

## 2023-04-16 ENCOUNTER — Other Ambulatory Visit: Payer: Self-pay

## 2023-04-16 ENCOUNTER — Encounter (HOSPITAL_COMMUNITY): Payer: Self-pay

## 2023-04-16 DIAGNOSIS — K8020 Calculus of gallbladder without cholecystitis without obstruction: Secondary | ICD-10-CM | POA: Insufficient documentation

## 2023-04-16 DIAGNOSIS — R001 Bradycardia, unspecified: Secondary | ICD-10-CM | POA: Insufficient documentation

## 2023-04-16 DIAGNOSIS — I498 Other specified cardiac arrhythmias: Secondary | ICD-10-CM | POA: Insufficient documentation

## 2023-04-16 LAB — CBC WITH DIFF
BASOPHIL #: 0.14 10*3/uL (ref <=0.20)
BASOPHIL %: 2 %
EOSINOPHIL #: 0.18 10*3/uL (ref <=0.50)
EOSINOPHIL %: 2.6 %
HCT: 35.2 % — ABNORMAL LOW (ref 38.9–52.0)
HGB: 12.3 g/dL — ABNORMAL LOW (ref 13.4–17.5)
IMMATURE GRANULOCYTE #: 0.05 10*3/uL (ref <0.10)
IMMATURE GRANULOCYTE %: 0.7 % (ref 0.0–1.0)
LYMPHOCYTE #: 1.18 10*3/uL (ref 1.00–4.80)
LYMPHOCYTE %: 17.2 %
MCH: 40.3 pg — ABNORMAL HIGH (ref 26.0–32.0)
MCHC: 34.9 g/dL (ref 31.0–35.5)
MCV: 115.4 fL — ABNORMAL HIGH (ref 78.0–100.0)
MONOCYTE #: 1 10*3/uL (ref 0.20–1.10)
MONOCYTE %: 14.6 %
MPV: 10 fL (ref 8.7–12.5)
NEUTROPHIL #: 4.3 10*3/uL (ref 1.50–7.70)
NEUTROPHIL %: 62.9 %
PLATELETS: 308 10*3/uL (ref 150–400)
RBC: 3.05 10*6/uL — ABNORMAL LOW (ref 4.50–6.10)
RDW-CV: 15.2 % (ref 11.5–15.5)
WBC: 6.9 10*3/uL (ref 3.7–11.0)

## 2023-04-16 LAB — URINALYSIS, MACROSCOPIC
BILIRUBIN: NEGATIVE mg/dL
BLOOD: NEGATIVE mg/dL
GLUCOSE: NEGATIVE mg/dL
KETONES: NEGATIVE mg/dL
LEUKOCYTES: NEGATIVE WBCs/uL
NITRITE: NEGATIVE
PH: 5.5
PROTEIN: NEGATIVE mg/dL
SPECIFIC GRAVITY: >=1.03 (ref 1.005–1.030)

## 2023-04-16 LAB — URINALYSIS, MICROSCOPIC

## 2023-04-16 LAB — COMPREHENSIVE METABOLIC PANEL, NON-FASTING
ALBUMIN: 3.9 g/dL (ref 3.4–4.8)
ALKALINE PHOSPHATASE: 126 U/L — ABNORMAL HIGH (ref 45–115)
ALT (SGPT): 13 U/L (ref <43)
ANION GAP: 11 mmol/L (ref 4–13)
AST (SGOT): 25 U/L (ref 11–34)
BILIRUBIN TOTAL: 0.6 mg/dL (ref 0.3–1.3)
BUN/CREA RATIO: 18 (ref 6–22)
BUN: 22 mg/dL (ref 8–25)
CALCIUM: 9.6 mg/dL (ref 8.6–10.3)
CHLORIDE: 107 mmol/L (ref 96–111)
CO2 TOTAL: 22 mmol/L — ABNORMAL LOW (ref 23–31)
CREATININE: 1.2 mg/dL (ref 0.75–1.35)
ESTIMATED GFR - MALE: 62 mL/min/BSA (ref >=60)
GLUCOSE: 153 mg/dL — ABNORMAL HIGH (ref 65–125)
POTASSIUM: 3.4 mmol/L — ABNORMAL LOW (ref 3.5–5.1)
PROTEIN TOTAL: 7.6 g/dL (ref 6.0–8.0)
SODIUM: 140 mmol/L (ref 136–145)

## 2023-04-16 LAB — MORPHOLOGY

## 2023-04-16 LAB — ECG 12 LEAD
Atrial Rate: 57 {beats}/min
Calculated P Axis: 59 degrees
Calculated R Axis: -9 degrees
Calculated T Axis: 16 degrees
PR Interval: 160 ms
QRS Duration: 82 ms
QT Interval: 446 ms
QTC Calculation: 434 ms
Ventricular rate: 57 {beats}/min

## 2023-04-16 LAB — LAVENDER TOP TUBE

## 2023-04-16 LAB — GOLD TOP TUBE

## 2023-04-16 LAB — BLUE TOP TUBE

## 2023-04-16 LAB — TROPONIN-I: TROPONIN-I HS: 7.2 ng/L (ref <19.8)

## 2023-04-16 LAB — LIPASE: LIPASE: 41 U/L (ref 10–60)

## 2023-04-16 MED ORDER — ONDANSETRON HCL (PF) 4 MG/2 ML INJECTION SOLUTION
4.0000 mg | INTRAMUSCULAR | Status: AC
Start: 2023-04-16 — End: 2023-04-16
  Administered 2023-04-16: 4 mg via INTRAVENOUS
  Filled 2023-04-16: qty 2

## 2023-04-16 MED ORDER — IOPAMIDOL 370 MG IODINE/ML (76 %) INTRAVENOUS SOLUTION
50.0000 mL | INTRAVENOUS | Status: AC
Start: 2023-04-16 — End: 2023-04-16
  Administered 2023-04-16: 50 mL via INTRAVENOUS

## 2023-04-16 MED ORDER — KETOROLAC 30 MG/ML (1 ML) INJECTION SOLUTION
15.0000 mg | INTRAMUSCULAR | Status: AC
Start: 2023-04-16 — End: 2023-04-16
  Administered 2023-04-16: 15 mg via INTRAVENOUS
  Filled 2023-04-16: qty 1

## 2023-04-16 NOTE — ED Provider Notes (Signed)
[facility] - Emergency Department  ED Primary Provider Note  History of Present Illness   Chief Complaint   Patient presents with    Back Pain     Bradley Phillips is a 77 y.o. male who had concerns including Back Pain.  Arrival: The patient arrived by Car    This is a 77 year old man with a history of diabetes and reflux.  He is complaining of an onset of pain yesterday evening that goes from his epigastrium and right upper quadrant to his back, pressure, moderate, with no modifying factors.  Complains of nausea without vomiting.  He denies other symptoms, including fever, trauma, difficulty with urination or defecation, shortness of breath, sweating, or other symptoms.      Back Pain    History Reviewed This Encounter: Medical History  Surgical History  Family History  Social History    Physical Exam   ED Triage Vitals   BP (Non-Invasive) 04/16/23 0314 124/67   Heart Rate 04/16/23 0314 71   Respiratory Rate 04/16/23 0314 18   Temperature 04/16/23 0314 36.1 C (96.9 F)   SpO2 04/16/23 0314 99 %   Weight 04/16/23 0312 83.9 kg (185 lb)   Height 04/16/23 0312 1.753 m (5\' 9" )     Physical Exam  Vitals and nursing note reviewed.   Constitutional:       General: He is not in acute distress.     Appearance: He is well-developed. He is ill-appearing.   HENT:      Head: Normocephalic and atraumatic.      Nose: Nose normal.      Mouth/Throat:      Mouth: Mucous membranes are moist.   Eyes:      Conjunctiva/sclera: Conjunctivae normal.   Cardiovascular:      Rate and Rhythm: Normal rate and regular rhythm.      Heart sounds: No murmur heard.  Pulmonary:      Effort: Pulmonary effort is normal. No respiratory distress.      Breath sounds: Normal breath sounds. No wheezing or rales.   Abdominal:      Palpations: Abdomen is soft.      Tenderness: There is no abdominal tenderness. There is no right CVA tenderness, left CVA tenderness, guarding or rebound.      Comments: Normal bowel sounds, no focal  tenderness in his abdomen.  Negative Murphy's sign.  No percussion tenderness.   Musculoskeletal:         General: No swelling.      Cervical back: Neck supple.   Skin:     General: Skin is warm and dry.      Capillary Refill: Capillary refill takes less than 2 seconds.   Neurological:      Mental Status: He is alert.   Psychiatric:         Mood and Affect: Mood normal.       Patient Data     Labs Ordered/Reviewed   COMPREHENSIVE METABOLIC PANEL, NON-FASTING - Abnormal; Notable for the following components:       Result Value    POTASSIUM 3.4 (*)     CO2 TOTAL 22 (*)     GLUCOSE 153 (*)     ALKALINE PHOSPHATASE 126 (*)     All other components within normal limits   CBC WITH DIFF - Abnormal; Notable for the following components:    RBC 3.05 (*)     HGB 12.3 (*)     HCT 35.2 (*)  MCV 115.4 (*)     MCH 40.3 (*)     All other components within normal limits   URINALYSIS, MICROSCOPIC - Abnormal; Notable for the following components:    SQUAMOUS EPITHELIAL CELLS Few (*)     All other components within normal limits   MORPHOLOGY - Abnormal; Notable for the following components:    MACROCYTOSIS 2+/Moderate (*)     All other components within normal limits   LIPASE - Normal   TROPONIN-I - Normal    Narrative:     TROPONIN INTERPRETATIVE COMMENT:    *ERRONEOUS RESULTS MAY BE OBTAINED ON SAMPLES FROM PATIENTS WHO HAVE BEEN TREATED WITH MOUSE MONOCLONAL ANTIBODIES OR WHO HAVE RECIEVED THEM FOR DIAGNOSTIC PURPOSES.      TROPONIN I VALUES ARE OF THE GREATEST VALUE WHEN SERIALLY PERFORMED.    PRESENCE OF HETEROPHILE ANTIBODIES HAVE BEEN KNOWN TO CAUSE ERRONEOUS RESULTS.    CBC/DIFF    Narrative:     The following orders were created for panel order CBC/DIFF.  Procedure                               Abnormality         Status                     ---------                               -----------         ------                     CBC WITH NWGN[[id number]]                Abnormal            Final result                MORPHOLOGY[[id number]]                   Abnormal            Final result                 Please view results for these tests on the individual orders.   URINALYSIS, MACROSCOPIC AND MICROSCOPIC W/CULTURE REFLEX    Narrative:     The following orders were created for panel order URINALYSIS, MACROSCOPIC AND MICROSCOPIC W/CULTURE REFLEX.  Procedure                               Abnormality         Status                     ---------                               -----------         ------                     URINALYSIS, MACROSCOPIC[[id number]]                          Final result  URINALYSIS, MICROSCOPIC[[id number]]      Abnormal            Final result                 Please view results for these tests on the individual orders.   URINALYSIS, MACROSCOPIC   BLUE TOP TUBE   EXTRA TUBES - CCM ONLY    Narrative:     The following orders were created for panel order EXTRA TUBES - CCM ONLY.  Procedure                               Abnormality         Status                     ---------                               -----------         ------                     BLUE TOP ZOXW[[id number]]                                    Final result               GOLD TOP JXBJ[[id number]]                                    In process                 LAVENDER TOP TUBE[[id number]]                                In process                   Please view results for these tests on the individual orders.   GOLD TOP TUBE   LAVENDER TOP TUBE     CT ABDOMEN PELVIS W IV CONTRAST   Final Result by Edi, Radresults In (11/21 0452)   1. No acute abdominal or pelvic process is noted.    2. Cholelithiasis, prosthetic hypertrophy and unremarkable rectosigmoid anastomosis are noted.                                 Radiologist location ID: [facility]           Medical Decision Making        Medical Decision Making  Differential diagnosis includes cholecystitis, cholelithiasis, pancreatitis, liver injury, gastritis, gastroenteritis, MI,  ureterolithiasis    Amount and/or Complexity of Data Reviewed  Labs: ordered.  Radiology: ordered.  ECG/medicine tests: ordered.    Risk  Prescription drug management.      ED Course as of 04/16/23 0636   Thu Apr 16, 2023   0533 EKG, interpreted by me, indication upper abdominal pain:  Sinus bradycardia with a sinus arrhythmia and a rate of 57 beats per minute, no acute ST-T changes, normal axis -9 degrees   0543 I briefed him with the results of the labs and CT.  Awaiting ultrasound results.  This patient was signed out to the care of Dr. Wilson Singer at 7:00 a.m..                   Medications Administered in the ED   ondansetron (ZOFRAN) 2 mg/mL injection (4 mg Intravenous Given 04/16/23 0350)   ketorolac (TORADOL) 30 mg/mL injection (15 mg Intravenous Given 04/16/23 0351)   iopamidol (ISOVUE-370) 76% infusion (50 mL Intravenous Given 04/16/23 0442)     Clinical Impression   Cholelithiasis (Primary)       Disposition: Data Unavailable

## 2023-04-16 NOTE — ED Triage Notes (Addendum)
Patient report pain pointing across lower/mid back.  Patient reports belching today.  Patient report "terrible pain," and he states he "thinks its a gallbladder attack." Patient states he has never been told by a doctor he has a gallbladder problem.  Patient is nauseous, has not vomited.

## 2023-04-16 NOTE — ED Nurses Note (Signed)
Patient discharged home with self.  AVS reviewed with patient.  A written copy of the AVS and discharge instructions was given to the patient.  Questions sufficiently answered as needed.  Patient encouraged to follow up with PCP as indicated.  In the event of an emergency, patient instructed to call 911 or go to the nearest emergency room.
